# Patient Record
Sex: Female | Born: 1985 | Race: White | Hispanic: No | Marital: Married | Smoking: Never smoker
Health system: Southern US, Community
[De-identification: ages and names within clinical notes are randomized; demographics above are authoritative.]

---

## 2020-05-15 ENCOUNTER — Emergency Department: Payer: 59

## 2020-05-15 ENCOUNTER — Other Ambulatory Visit: Payer: Self-pay

## 2020-05-15 ENCOUNTER — Emergency Department
Admission: EM | Admit: 2020-05-15 | Discharge: 2020-05-15 | Disposition: A | Discharge status: Home / Self Care | Payer: 59 | Attending: Emergency Medicine | Admitting: Emergency Medicine

## 2020-05-15 DIAGNOSIS — R079 Chest pain, unspecified: Secondary | ICD-10-CM | POA: Diagnosis present

## 2020-05-15 DIAGNOSIS — R0602 Shortness of breath: Secondary | ICD-10-CM | POA: Diagnosis not present

## 2020-05-15 DIAGNOSIS — R0789 Other chest pain: Secondary | ICD-10-CM

## 2020-05-15 DIAGNOSIS — M940 Chondrocostal junction syndrome [Tietze]: Secondary | ICD-10-CM | POA: Diagnosis not present

## 2020-05-15 LAB — CBC
HCT: 40 % (ref 36.0–46.0)
Hemoglobin: 13.8 g/dL (ref 12.0–15.0)
MCH: 31.7 pg (ref 26.0–34.0)
MCHC: 34.5 g/dL (ref 30.0–36.0)
MCV: 92 fL (ref 80.0–100.0)
Platelets: 282 10*3/uL (ref 150–400)
RBC: 4.35 MIL/uL (ref 3.87–5.11)
RDW: 11.7 % (ref 11.5–15.5)
WBC: 5.2 10*3/uL (ref 4.0–10.5)
nRBC: 0 % (ref 0.0–0.2)

## 2020-05-15 LAB — BASIC METABOLIC PANEL
Anion gap: 7 (ref 5–15)
BUN: 14 mg/dL (ref 6–20)
CO2: 28 mmol/L (ref 22–32)
Calcium: 8.8 mg/dL — ABNORMAL LOW (ref 8.9–10.3)
Chloride: 107 mmol/L (ref 98–111)
Creatinine, Ser: 0.59 mg/dL (ref 0.44–1.00)
GFR, Estimated: >60 mL/min (ref >60)
Glucose, Bld: 114 mg/dL — ABNORMAL HIGH (ref 70–99)
Potassium: 3.4 mmol/L — ABNORMAL LOW (ref 3.5–5.1)
Sodium: 142 mmol/L (ref 135–145)

## 2020-05-15 LAB — TROPONIN I (HIGH SENSITIVITY): Troponin I (High Sensitivity): 3 ng/L

## 2020-05-15 MED ORDER — PREDNISONE 10 MG (21) PO TBPK: ORAL_TABLET | ORAL | 0 refills | Status: DC

## 2020-05-15 MED ORDER — FAMOTIDINE 20 MG PO TABS: 20.0000 mg | ORAL_TABLET | Freq: Two times a day (BID) | ORAL | 0 refills | Status: AC

## 2020-05-15 MED ORDER — PREDNISONE 20 MG PO TABS
60.0000 mg | ORAL_TABLET | ORAL | Status: AC
  Administered 2020-05-15: 60 mg via ORAL
  Filled 2020-05-15: qty 3

## 2020-05-15 MED ORDER — NAPROXEN 500 MG PO TABS: 500.0000 mg | ORAL_TABLET | Freq: Two times a day (BID) | ORAL | 0 refills | Status: AC

## 2020-05-15 MED ORDER — KETOROLAC TROMETHAMINE 10 MG PO TABS
10.0000 mg | ORAL_TABLET | Freq: Once | ORAL | Status: AC
  Administered 2020-05-15: 10 mg via ORAL
  Filled 2020-05-15: qty 1

## 2020-05-15 NOTE — ED Triage Notes (Signed)
Reports left sided chest pain "smothering" feeling and SOB that started a few hours ago. Reports the SOB is intermittent. Pt tearful.  Pt alert and oriented X4, cooperative, RR even and unlabored, color WNL. Pt in NAD.

## 2020-05-15 NOTE — ED Provider Notes (Signed)
[facility] Emergency Department Provider Note  ____________________________________________  Time seen: Approximately 3:39 PM  I have reviewed the triage vital signs and the nursing notes.   HISTORY  Chief Complaint Chest Pain and Shortness of Breath    HPI Priscilla Edwards is a 34 y.o. female with no significant past medical history who comes ED complaining of central chest pain that feels like tightness that started about 10 AM today, gradual onset, constant, somewhat worsening but waxing and waning.  Worse with talking and movement.  No alleviating factors, nonradiating, associated with intermittent shortness of breath as well.  No cough, not pleuritic, not exertional, not sharp or stabbing.  No recent travel trauma hospitalization or surgery.  Takes no systemic hormonal medication but does have a history of Mirena use.  No history of DVT or PE.      History reviewed. No pertinent past medical history.   There are no problems to display for this patient.    History reviewed. No pertinent surgical history.   Prior to Admission medications   Medication Sig Start Date End Date Taking? Authorizing Provider  famotidine (PEPCID) 20 MG tablet Take 1 tablet (20 mg total) by mouth 2 (two) times daily for 7 days. 05/15/20 05/22/20  Sharman Cheek, MD  naproxen (NAPROSYN) 500 MG tablet Take 1 tablet (500 mg total) by mouth 2 (two) times daily with a meal. 05/15/20   Sharman Cheek, MD  predniSONE (STERAPRED UNI-PAK 21 TAB) 10 MG (21) TBPK tablet 6 tablets on day 1, then 5 tablets on day 2, then 4 tablets on day 3, then 3 tablets on day 4, then 2 tablets on day 5, then 1 tablet on day 6. 05/15/20   Sharman Cheek, MD     Allergies Patient has no known allergies.   No family history on file.  Social History Social History   Tobacco Use  . Smoking status: Never Smoker  Substance Use Topics  . Alcohol use: Not Currently  . Drug use: Not on file     Review of Systems  Constitutional:   No fever or chills.  ENT:   No sore throat. No rhinorrhea. Cardiovascular: Positive chest pain as above without syncope. Respiratory: Positive intermittent shortness of breath as above without cough. Gastrointestinal:   Negative for abdominal pain, vomiting and diarrhea.  Musculoskeletal:   Negative for focal pain or swelling All other systems reviewed and are negative except as documented above in ROS and HPI.  ____________________________________________   PHYSICAL EXAM:  VITAL SIGNS: ED Triage Vitals  Enc Vitals Group     BP 05/15/20 1312 129/85     Pulse Rate 05/15/20 1312 88     Resp 05/15/20 1312 20     Temp 05/15/20 1312 98.4 F (36.9 C)     Temp Source 05/15/20 1312 Oral     SpO2 05/15/20 1312 99 %     Weight 05/15/20 1339 116 lb (52.6 kg)     Height 05/15/20 1339 5\' 2"  (1.575 m)     Head Circumference --      Peak Flow --      Pain Score 05/15/20 1339 5     Pain Loc --      Pain Edu? --      Excl. in GC? --     Vital signs reviewed, nursing assessments reviewed.   Constitutional:   Alert and oriented. Non-toxic appearance. Eyes:   Conjunctivae are normal. EOMI. PERRL. ENT      Head:  Normocephalic and atraumatic.      Nose:   Wearing a mask.      Mouth/Throat:   Wearing a mask.      Neck:   No meningismus. Full ROM. Hematological/Lymphatic/Immunilogical:   No cervical lymphadenopathy. Cardiovascular:   RRR. Symmetric bilateral radial and DP pulses.  No murmurs. Cap refill less than 2 seconds. Respiratory:   Normal respiratory effort without tachypnea/retractions. Breath sounds are clear and equal bilaterally. No wheezes/rales/rhonchi. Gastrointestinal:   Soft and nontender. Non distended. There is no CVA tenderness.  No rebound, rigidity, or guarding. Musculoskeletal:   Normal range of motion in all extremities. No joint effusions.  No lower extremity tenderness.  No edema.  Chest pain reproducible with palpation of  the anterior chest over the sternum as demonstrated by the patient Neurologic:   Normal speech and language.  Motor grossly intact. No acute focal neurologic deficits are appreciated.  Skin:    Skin is warm, dry and intact. No rash noted.  No petechiae, purpura, or bullae.  ____________________________________________    LABS (pertinent positives/negatives) (all labs ordered are listed, but only abnormal results are displayed) Labs Reviewed  BASIC METABOLIC PANEL - Abnormal; Notable for the following components:      Result Value   Potassium 3.4 (*)    Glucose, Bld 114 (*)    Calcium 8.8 (*)    All other components within normal limits  CBC  POC URINE PREG, ED  TROPONIN I (HIGH SENSITIVITY)   ____________________________________________   EKG  Interpreted by me Sinus rhythm rate of 90.  Normal axis and intervals.  Poor R progression.  Normal ST segments and T waves.  No ischemic changes or underlying dysrhythmia.  ____________________________________________    RADIOLOGY  DG Chest 2 View  Result Date: 05/15/2020 CLINICAL DATA:  Chest pain. EXAM: CHEST - 2 VIEW COMPARISON:  No prior. FINDINGS: Mediastinum hilar structures normal. Lungs are clear. No pleural effusion or pneumothorax. Heart size normal. Mild thoracic spine scoliosis. IMPRESSION: No acute cardiopulmonary disease. Electronically Signed   By: Maisie Fus  Register   On: 05/15/2020 13:31    ____________________________________________   PROCEDURES Procedures  ____________________________________________    CLINICAL IMPRESSION / ASSESSMENT AND PLAN / ED COURSE  Medications ordered in the ED: Medications  ketorolac (TORADOL) tablet 10 mg (has no administration in time range)  predniSONE (DELTASONE) tablet 60 mg (has no administration in time range)    Pertinent labs & imaging results that were available during my care of the patient were reviewed by me and considered in my medical decision making (see  chart for details).  Priscilla Edwards was evaluated in Emergency Department on 05/15/2020 for the symptoms described in the history of present illness. She was evaluated in the context of the global COVID-19 pandemic, which necessitated consideration that the patient might be at risk for infection with the SARS-CoV-2 virus that causes COVID-19. Institutional protocols and algorithms that pertain to the evaluation of patients at risk for COVID-19 are in a state of rapid change based on information released by regulatory bodies including the CDC and federal and state organizations. These policies and algorithms were followed during the patient's care in the ED.   Patient presents with atypical, noncardiac chest pain, by physical exam very suggestive of musculoskeletal/costochondritis pain.  This is likely provoked by her recent Covid illness within the last 2 weeks.  Considering the patient's symptoms, medical history, and physical examination today, I have low suspicion for ACS, PE, TAD, pneumothorax, carditis, mediastinitis, pneumonia,  CHF, or sepsis.  PE is very unlikely given normal vital signs, lack of sharp pleuritic chest pain, lack of significant risk factors.  Vital signs unremarkable, EKG nonischemic and reassuring.  Chest x-ray image viewed by me, unremarkable with no signs of pleural effusion pneumothorax or pneumonia.  Radiology report reviewed and agrees.  Initial lab panel all normal.  Troponin was obtained from triage protocol, is normal, with noncardiac presentation, do not feel that cardiac work-up is indicated.  We will treat with NSAIDs, short burst of prednisone, Pepcid for gastric protection.        ____________________________________________   FINAL CLINICAL IMPRESSION(S) / ED DIAGNOSES    Final diagnoses:  Costochondritis  Chest wall pain     ED Discharge Orders         Ordered    naproxen (NAPROSYN) 500 MG tablet  2 times daily with meals        05/15/20 1538     predniSONE (STERAPRED UNI-PAK 21 TAB) 10 MG (21) TBPK tablet        05/15/20 1538    famotidine (PEPCID) 20 MG tablet  2 times daily        05/15/20 1538          Portions of this note were generated with dragon dictation software. Dictation errors may occur despite best attempts at proofreading.   Sharman Cheek, MD 05/15/20 1544

## 2020-07-21 ENCOUNTER — Emergency Department: Payer: 59

## 2020-07-21 ENCOUNTER — Other Ambulatory Visit: Payer: Self-pay

## 2020-07-21 ENCOUNTER — Emergency Department
Admission: EM | Admit: 2020-07-21 | Discharge: 2020-07-21 | Disposition: A | Discharge status: Home / Self Care | Payer: 59 | Attending: Emergency Medicine | Admitting: Emergency Medicine

## 2020-07-21 DIAGNOSIS — M545 Low back pain, unspecified: Secondary | ICD-10-CM | POA: Diagnosis not present

## 2020-07-21 LAB — POC URINE PREG, ED: Preg Test, Ur: NEGATIVE

## 2020-07-21 MED ORDER — DEXAMETHASONE SODIUM PHOSPHATE 10 MG/ML IJ SOLN
10.0000 mg | Freq: Once | INTRAMUSCULAR | Status: AC
  Administered 2020-07-21: 10 mg via INTRAMUSCULAR
  Filled 2020-07-21: qty 1

## 2020-07-21 MED ORDER — HYDROCODONE-ACETAMINOPHEN 5-325 MG PO TABS
1.0000 | ORAL_TABLET | Freq: Once | ORAL | Status: AC
  Administered 2020-07-21: 1 via ORAL
  Filled 2020-07-21: qty 1

## 2020-07-21 MED ORDER — ONDANSETRON 4 MG PO TBDP
4.0000 mg | ORAL_TABLET | Freq: Once | ORAL | Status: AC
  Administered 2020-07-21: 4 mg via ORAL
  Filled 2020-07-21: qty 1

## 2020-07-21 MED ORDER — LIDOCAINE 5 % EX PTCH
1.0000 | MEDICATED_PATCH | CUTANEOUS | Status: DC
  Administered 2020-07-21: 1 via TRANSDERMAL
  Filled 2020-07-21: qty 1

## 2020-07-21 MED ORDER — METHOCARBAMOL 750 MG PO TABS: 750.0000 mg | ORAL_TABLET | Freq: Four times a day (QID) | ORAL | 0 refills | Status: AC | PRN

## 2020-07-21 MED ORDER — PREDNISONE 10 MG PO TABS: ORAL_TABLET | ORAL | 0 refills | Status: AC

## 2020-07-21 MED ORDER — METHOCARBAMOL 500 MG PO TABS
750.0000 mg | ORAL_TABLET | Freq: Once | ORAL | Status: AC
  Administered 2020-07-21: 750 mg via ORAL
  Filled 2020-07-21: qty 2

## 2020-07-21 MED ORDER — ACETAMINOPHEN 325 MG PO TABS
650.0000 mg | ORAL_TABLET | Freq: Once | ORAL | Status: AC
  Administered 2020-07-21: 650 mg via ORAL
  Filled 2020-07-21: qty 2

## 2020-07-21 MED ORDER — LIDOCAINE 5 % EX PTCH: 1.0000 | MEDICATED_PATCH | CUTANEOUS | 0 refills | Status: AC

## 2020-07-21 NOTE — ED Notes (Signed)
Pt provided with cup to provide urine

## 2020-07-21 NOTE — ED Notes (Signed)
PA Priscilla Edwards approved pt to eat. Pt notified.

## 2020-07-21 NOTE — ED Notes (Signed)
Pt on phone with MRI at this time.  

## 2020-07-21 NOTE — ED Provider Notes (Signed)
[facility] Emergency Department Provider Note  ____________________________________________   Event Date/Time   First MD Initiated Contact with Patient 07/21/20 1753     (approximate)  I have reviewed the triage vital signs and the nursing notes.   HISTORY  Chief Complaint Back Pain  HPI Priscilla Edwards is a 35 y.o. female who presents to the emergency department for evaluation of low back pain that has been present since just prior to Christmas.  Patient reports pain has been progressively worsening, particularly over the last week.  Patient reports sharp pain in the middle lower portion of her back, just above the sacrum that feels like a sharp pressure and radiates into the buttocks.  She reports she is unable to sit up due to significant pain.  Also upon sitting, she reports radiating sharp pains into her vaginal area with a sensation like she is going to have loss of control of her bladder or bowels.  She has not actually had loss of these to date.  She reports that in order to avoid the sensation, she has to lay on her back.  She reports her pain "beyond" a 10/10.  She states no history of pain like this. She had made appointment with her primary care for next week, however had acute worsening today during injury. She reports she was trying to hold 1 dog back along trying to get another dog in the door and felt a pop and had immediate weakness of her legs and fell and was unable to get up. She reports that when her stepson returned a short while later he "found her in the floor" and had to help her up. She denies any fevers, current weakness in the extremities. 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         History reviewed. No pertinent past medical history.  There are no problems to display for this patient.   History  reviewed. No pertinent surgical history.  Prior to Admission medications   Medication Sig Start Date End Date Taking? Authorizing Provider  lidocaine (LIDODERM) 5 % Place 1 patch onto the skin daily. Remove & Discard patch within 12 hours or as directed by MD 07/21/20  Yes Lucy Chris, PA  methocarbamol (ROBAXIN-750) 750 MG tablet Take 1 tablet (750 mg total) by mouth 4 (four) times daily as needed for up to 10 days for muscle spasms. 07/21/20 07/31/20 Yes Isadora Delorey, Ruben Gottron, PA  predniSONE (DELTASONE) 10 MG tablet Take 6 tablets (60 mg total) by mouth daily for 1 day, THEN 5 tablets (50 mg total) daily for 1 day, THEN 4 tablets (40 mg total) daily for 1 day, THEN 3 tablets (30 mg total) daily for 1 day, THEN 2 tablets (20 mg total) daily for 1 day, THEN 1 tablet (10 mg total) daily for 1 day. 07/21/20 07/27/20 Yes Solyana Nonaka, Ruben Gottron, PA  famotidine (PEPCID) 20 MG tablet Take 1 tablet (20 mg total) by mouth 2 (two) times daily for 7 days. 05/15/20 05/22/20  Sharman Cheek, MD  naproxen (NAPROSYN) 500 MG tablet Take 1 tablet (500 mg total) by mouth 2 (two) times daily with a meal. 05/15/20   Sharman Cheek, MD    Allergies Patient has no known allergies.  No family history on file.  Social History Social History   Tobacco Use  .  Smoking status: Never Smoker  Substance Use Topics  . Alcohol use: Not Currently    Review of Systems Constitutional: No fever/chills Eyes: No visual changes. ENT: No sore throat. Cardiovascular: Denies chest pain. Respiratory: Denies shortness of breath. Gastrointestinal: No abdominal pain.  No nausea, no vomiting.  No diarrhea.  No constipation. Genitourinary: Negative for dysuria. Musculoskeletal: + back pain Skin: Negative for rash. Neurological: Negative for headaches, focal weakness or numbness.  ____________________________________________   PHYSICAL EXAM:  VITAL SIGNS: ED Triage Vitals  Enc Vitals Group     BP 07/21/20 1743 140/71      Pulse Rate 07/21/20 1743 65     Resp 07/21/20 1743 18     Temp 07/21/20 1743 98.2 F (36.8 C)     Temp src --      SpO2 07/21/20 1743 100 %     Weight 07/21/20 1934 115 lb (52.2 kg)     Height 07/21/20 1934 5\' 2"  (1.575 m)     Head Circumference --      Peak Flow --      Pain Score 07/21/20 1741 10     Pain Loc --      Pain Edu? --      Excl. in GC? --     Constitutional: Alert and oriented. In no acute distress. Upon entering the room, she is laying flat on the bed with knees bent secondary to pain. Eyes: Conjunctivae are normal. PERRL. EOMI. Head: Atraumatic. Nose: No congestion/rhinnorhea. Mouth/Throat: Mucous membranes are moist.  Oropharynx non-erythematous. Neck: No stridor.   Cardiovascular: Normal rate, regular rhythm. Grossly normal heart sounds.  Good peripheral circulation. Respiratory: Normal respiratory effort.  No retractions. Lungs CTAB. Gastrointestinal: Soft and nontender. No distention. No abdominal bruits. No CVA tenderness. Musculoskeletal: There is tenderness to palpation of the midline of the lumbar spine in the L4/L5 region. There is also tenderness of the SI joints bilaterally and gluteal muscles bilaterally. She has 5/5 strength in ankle plantarflexion, dorsiflexion, knee flexion, knee extension and hip flexion bilaterally, though is reproducing her pain in the back. Positive straight leg raise on the left. Distal sensation and pulses intact. Neurologic:  Normal speech and language. No gross focal neurologic deficits are appreciated. No gait instability. Skin:  Skin is warm, dry and intact. No rash noted. Psychiatric: Mood and affect are normal. Speech and behavior are normal. ____________________________________________  RADIOLOGY I, Lucy Chrisaitlin J Pluma Diniz, personally viewed and evaluated these images (plain radiographs) as part of my medical decision making, as well as reviewing the written report by the radiologist.  ED provider interpretation: X-rays  revealed no acute abnormality.  Official radiology report(s): DG Lumbar Spine 2-3 Views  Result Date: 07/21/2020 CLINICAL DATA:  35 year old female with low back pain. EXAM: LUMBAR SPINE - 2-3 VIEW COMPARISON:  None. FINDINGS: Five lumbar type vertebra. There is no acute fracture or subluxation of the lumbar spine. The vertebral body heights and disc spaces are maintained. The visualized posterior elements are intact. The soft tissues are unremarkable. IMPRESSION: Negative. Electronically Signed   By: Elgie CollardArash  Radparvar M.D.   On: 07/21/2020 19:17   DG Pelvis 1-2 Views  Result Date: 07/21/2020 CLINICAL DATA:  Back pain with fall EXAM: PELVIS - 1-2 VIEW COMPARISON:  None. FINDINGS: IUD in the pelvis. Pubic symphysis and rami are intact. No fracture or malalignment. IMPRESSION: Negative. Electronically Signed   By: Jasmine PangKim  Fujinaga M.D.   On: 07/21/2020 19:17   MR LUMBAR SPINE WO CONTRAST  Result Date: 07/21/2020 CLINICAL  DATA:  Low back pain.  Cauda equina syndrome suspected. EXAM: MRI LUMBAR SPINE WITHOUT CONTRAST TECHNIQUE: Multiplanar, multisequence MR imaging of the lumbar spine was performed. No intravenous contrast was administered. COMPARISON:  Lumbar radiographs 07/21/2020 FINDINGS: Segmentation:  Normal.  Lowest disc space L5-S1. Alignment:  Normal Vertebrae:  Normal bone marrow.  Negative for fracture or mass. Conus medullaris and cauda equina: Conus extends to the L1-2 level. Conus and cauda equina appear normal. Paraspinal and other soft tissues: Negative for paraspinous mass, adenopathy, or soft tissue edema. Small amount of free fluid in the pelvis. Disc levels: L1-2: Normal L2-3: Normal L3-4: Small central disc protrusion and mild facet degeneration. No significant stenosis or neural impingement L4-5: Small to moderate central disc protrusion indenting the thecal sac. No significant spinal or foraminal stenosis. Negative for neural impingement. L5-S1: Negative IMPRESSION: Small central disc  protrusions L3-4 and L4-5. No significant neural impingement Electronically Signed   By: Marlan Palau M.D.   On: 07/21/2020 21:56    ____________________________________________   INITIAL IMPRESSION / ASSESSMENT AND PLAN / ED COURSE  As part of my medical decision making, I reviewed the following data within the electronic MEDICAL RECORD NUMBER Nursing notes reviewed and incorporated, Radiograph reviewed  and [place] Controlled Substance Database        Patient is a 35 year old female who presents to the emergency department for evaluation of significant back pain with limited range of motion, reported weakness and sensation that she is going to lose control of her bowel or bladder when in a seated position. See HPI for further details. In triage, the patient has normal vital signs and is afebrile. On physical exam, the patient does not have any focal neurodeficits appreciated, though she does appear in a significant amount of pain. She also has a reported history of "tumors" on her spine. These were reported in roughly 1993 in 1998. Review of the patient's chart does reveal that she was admitted to [facility] around the timeline of the dates provided, though the specific details of the admission are not available given their age. Initial work-up began with x-rays of the lumbar spine and pelvis which were negative for any acute fracture. Given the patient's significant amount of pain as well as weakness to the point where she was unable to get herself up out of the floor as well as reports of feeling like she is going to lose control of her bowel or bladder, MRI of the lumbar spine was performed for concern for cauda equina syndrome. Low suspicion for osteomyelitis, discitis or other infectious cause given the patient's lack of systemic symptoms or fever. MRI was negative for cauda equina though there is some mild disc irregularity. We will treat the patient for her significant amount of acute low back  pain. Given that the patient is not diabetic, we will begin with IM Decadron as well as lidocaine patch, Robaxin and Tylenol. We will send outpatient prescriptions for prednisone pack as well as muscle relaxant. Patient is amenable with this plan. Strict return precautions were discussed with the patient. We will have the patient follow-up with neurosurgery to ensure resolution or to determine next steps if the symptoms do not improve. Patient is amenable with this plan she is stable this time for outpatient therapy.      ____________________________________________   FINAL CLINICAL IMPRESSION(S) / ED DIAGNOSES  Final diagnoses:  Acute midline low back pain without sciatica     ED Discharge Orders  Ordered    lidocaine (LIDODERM) 5 %  Every 24 hours        07/21/20 2230    predniSONE (DELTASONE) 10 MG tablet        07/21/20 2230    methocarbamol (ROBAXIN-750) 750 MG tablet  4 times daily PRN        07/21/20 2230          *Please note:  Tmya Wigington was evaluated in Emergency Department on 07/21/2020 for the symptoms described in the history of present illness. She was evaluated in the context of the global COVID-19 pandemic, which necessitated consideration that the patient might be at risk for infection with the SARS-CoV-2 virus that causes COVID-19. Institutional protocols and algorithms that pertain to the evaluation of patients at risk for COVID-19 are in a state of rapid change based on information released by regulatory bodies including the CDC and federal and state organizations. These policies and algorithms were followed during the patient's care in the ED.  Some ED evaluations and interventions may be delayed as a result of limited staffing during and the pandemic.*   Note:  This document was prepared using Dragon voice recognition software and may include unintentional dictation errors.    Lucy Chris, PA 07/21/20 2337    Concha Se, MD 07/25/20  [phone]

## 2020-07-21 NOTE — ED Triage Notes (Addendum)
Pt comes via POV from home with c/o lower back pain. Pt states it is really painful. Pt states this started around Christmas and has gotten worse.  Pt states today it was so bad she felt she couldn't bear weight. Pt states something may have popped.  Pt states back spasms.

## 2021-05-25 ENCOUNTER — Other Ambulatory Visit: Payer: Self-pay | Admitting: Student

## 2021-05-25 DIAGNOSIS — G43019 Migraine without aura, intractable, without status migrainosus: Secondary | ICD-10-CM

## 2021-05-28 ENCOUNTER — Ambulatory Visit: Payer: 59

## 2021-06-09 ENCOUNTER — Ambulatory Visit
Admission: RE | Admit: 2021-06-09 | Discharge: 2021-06-09 | Disposition: A | Discharge status: Home / Self Care | Payer: 59 | Source: Ambulatory Visit | Attending: Student | Admitting: Student

## 2021-06-09 DIAGNOSIS — G43019 Migraine without aura, intractable, without status migrainosus: Secondary | ICD-10-CM | POA: Insufficient documentation

## 2023-04-24 IMAGING — MR MR HEAD W/O CM
11 series · 47 of 48 positions shown · non-contrast
Comparison: No pertinent prior exams available for comparison.

CLINICAL DATA: Intractable migraine without Joshjax and without status
migrainosus. Additional history provided by scanning technologist:
Patient reports chronic headaches (for 20 years), lightheadedness,
nausea, dizziness.

EXAM:
MRI HEAD WITHOUT CONTRAST
TECHNIQUE: Multiplanar, multiecho pulse sequences of the brain and surrounding
structures were obtained without intravenous contrast.

[Series 5: ax dwi_tracew · axial · 3.0mm · 0.65mm/px · z∈[-112,+41]mm · 4 of 48 slices shown]
[im 1/48]
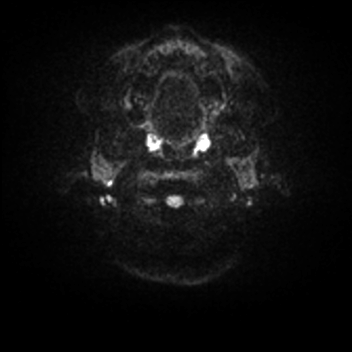
[im 16/48]
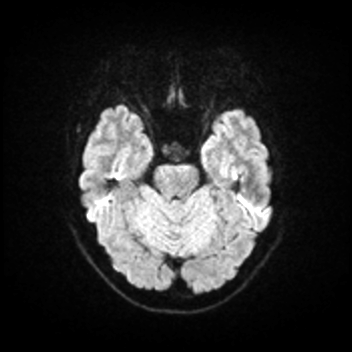
[im 32/48]
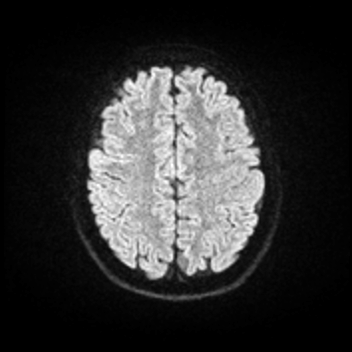
[im 48/48]
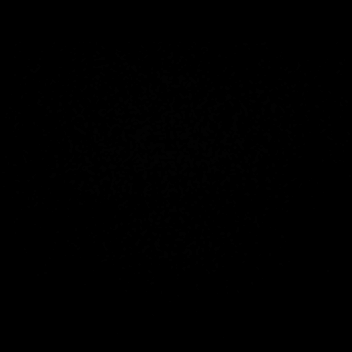

[Series 6: ax dwi_adc · axial · 3.0mm · 0.65mm/px · z∈[-112,+31]mm · 4 of 45 slices shown]
[im 1/45]
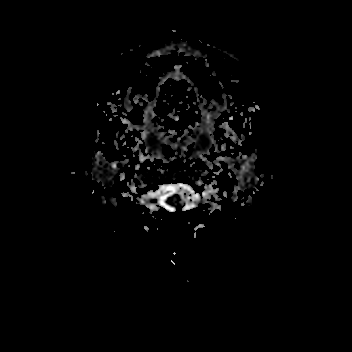
[im 15/45]
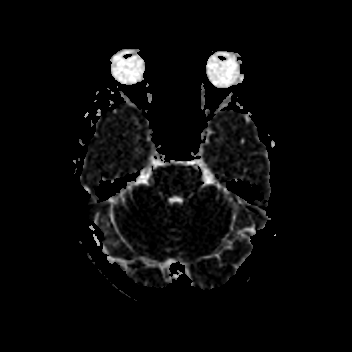
[im 30/45]
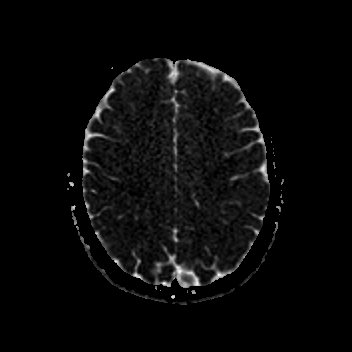
[im 45/45]
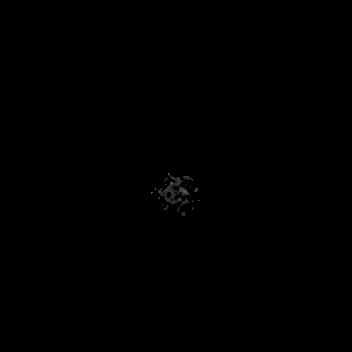

[Series 7: cor dwi_tracew · coronal · 5.0mm · 0.65mm/px · 3 of 40 slices shown]
[im 1/40]
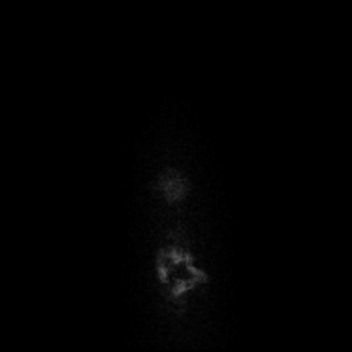
[im 20/40]
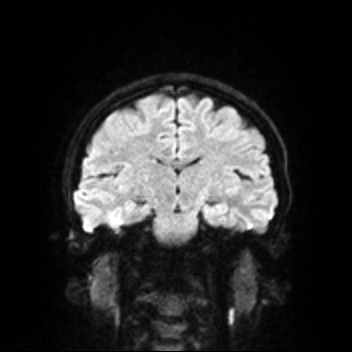
[im 40/40]
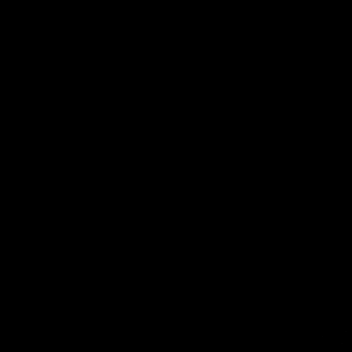

[Series 8: cor dwi_adc · coronal · 5.0mm · 0.65mm/px · 3 of 37 slices shown]
[im 1/37]
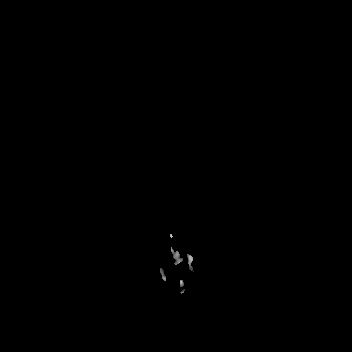
[im 19/37]
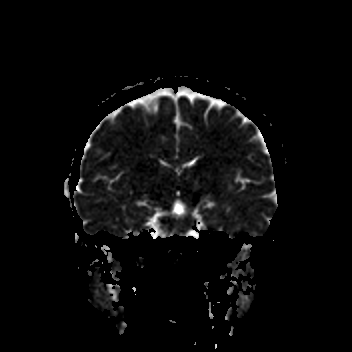
[im 37/37]
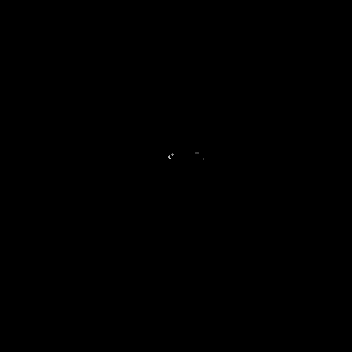

[Series 9: T1 · sagittal · 5.0mm · 0.62mm/px · 2 of 24 slices shown (1 of 2)]
[im 1/24]
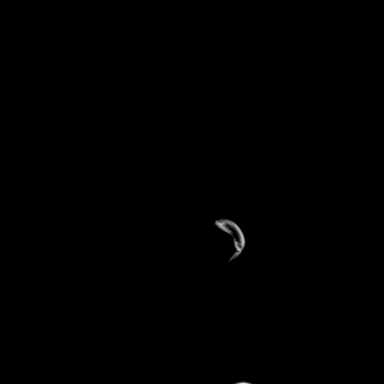
[im 24/24]
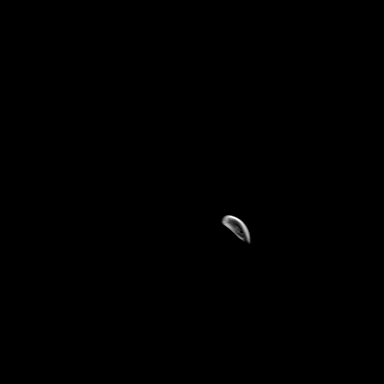

[Series 10: T2 · axial · 5.0mm · 0.53mm/px · z∈[-105,+36]mm · 2 of 25 slices shown (1 of 2)]
[im 1/25]
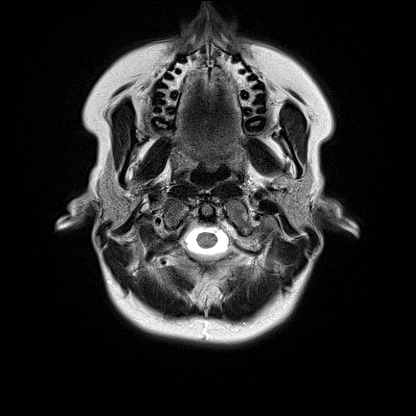
[im 25/25]
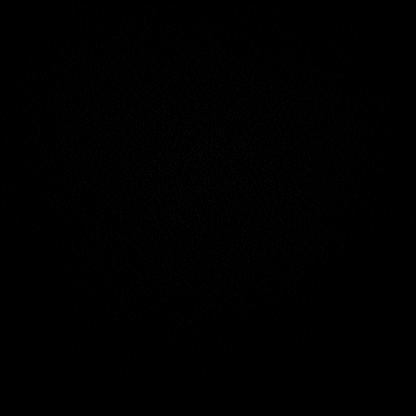

[Series 12: pha_images · axial · 3.0mm · 0.90mm/px · z∈[-122,+49]mm · 5 of 56 slices shown]
[im 1/56]
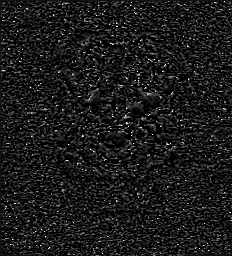
[im 14/56]
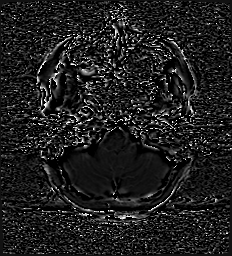
[im 28/56]
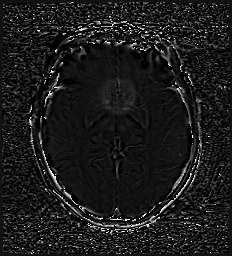
[im 42/56]
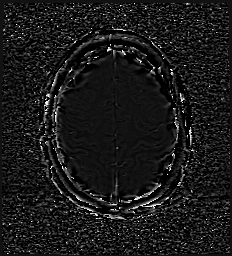
[im 56/56]
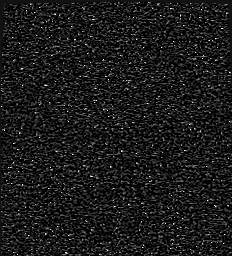

[Series 13: swi_images · axial · 3.0mm · 0.90mm/px · z∈[-122,+52]mm · 5 of 60 slices shown]
[im 1/60]
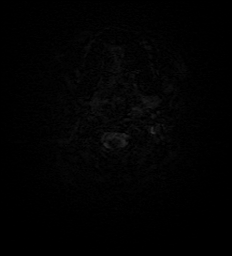
[im 15/60]
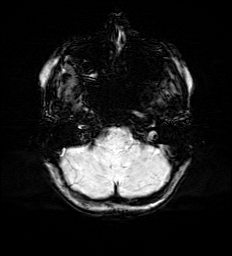
[im 30/60]
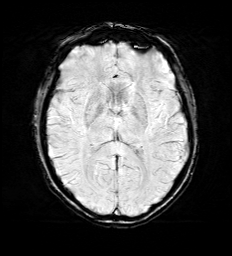
[im 45/60]
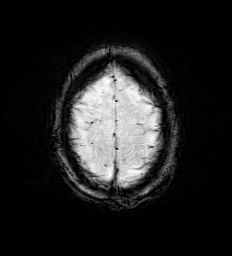
[im 60/60]
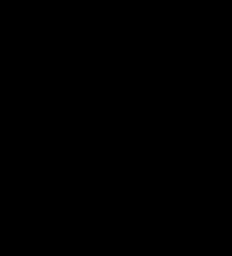

[Series 15: FLAIR · axial · 3.0mm · 0.53mm/px · z∈[-114,+45]mm · 4 of 55 slices shown]
[im 1/55]
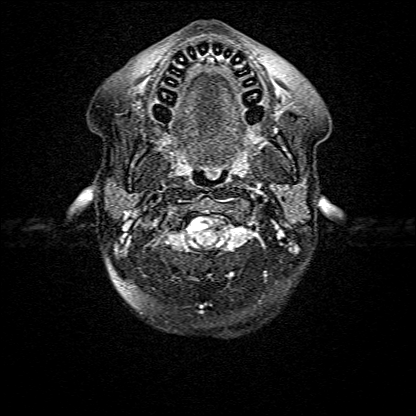
[im 19/55]
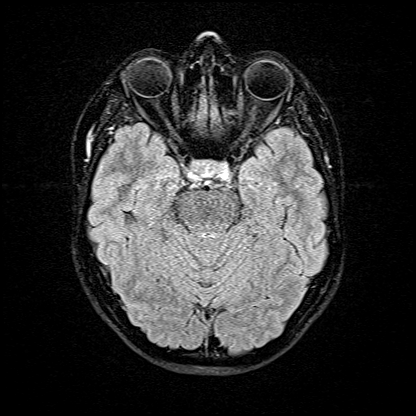
[im 37/55]
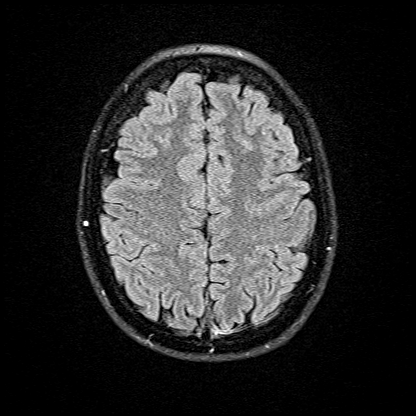
[im 55/55]
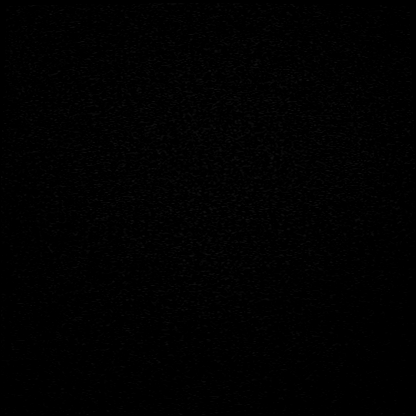

[Series 16: T1 · axial · 1.0mm · 0.98mm/px · z∈[-123,+49]mm · 13 of 176 slices shown (2 of 2)]
[im 1/176]
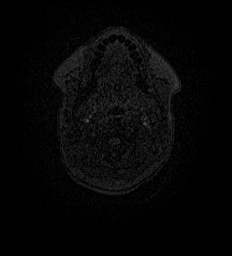
[im 14/176]
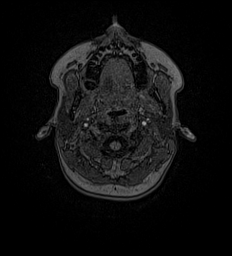
[im 27/176]
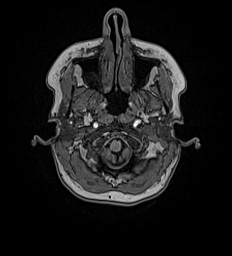
[im 41/176]
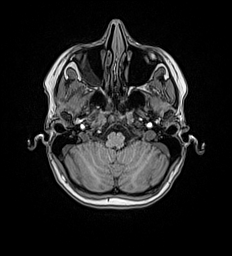
[im 54/176]
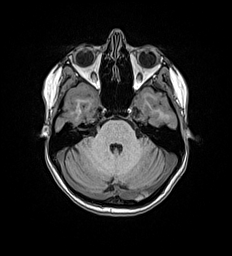
[im 68/176]
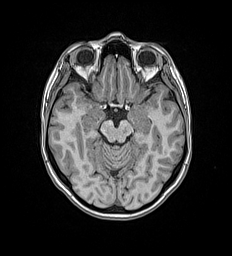
[im 81/176]
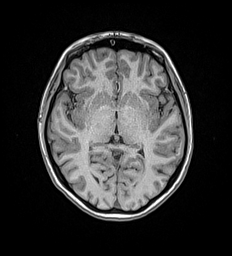
[im 95/176]
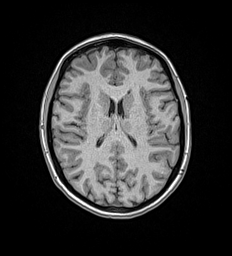
[im 108/176]
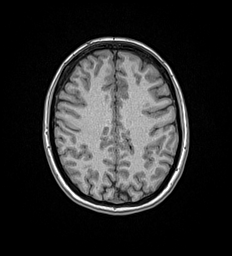
[im 122/176]
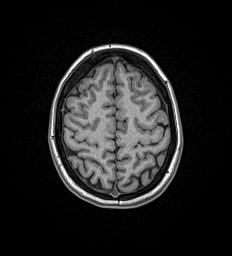
[im 135/176]
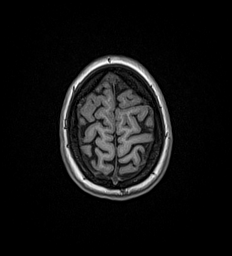
[im 149/176]
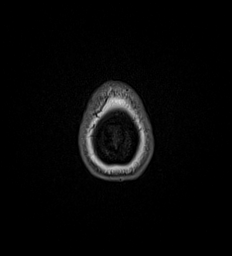
[im 176/176]
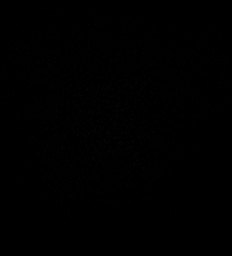

[Series 17: T2 · coronal · 5.0mm · 0.57mm/px · 2 of 29 slices shown (2 of 2)]
[im 1/29]
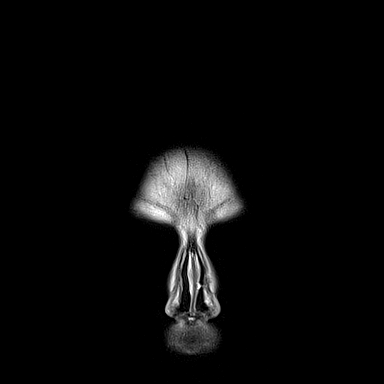
[im 29/29]
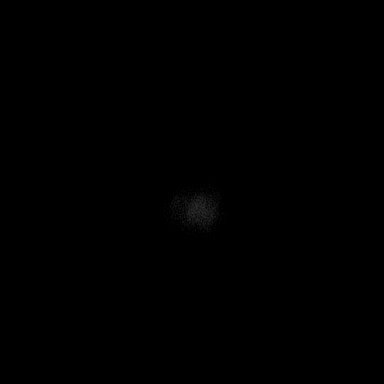

[47 of 48 positions shown; findings below may reference images not displayed]

FINDINGS: Brain:

Cerebral volume is normal.

No cortical encephalomalacia is identified. No significant cerebral
white matter disease.

There is no acute infarct.

No evidence of an intracranial mass.

No chronic intracranial blood products.

No extra-axial fluid collection.

No midline shift.

Vascular: Maintained flow voids within the proximal large arterial
vessels.

Skull and upper cervical spine: No focal suspicious marrow lesion.

Sinuses/Orbits: Visualized orbits show no acute finding. 3.4 cm
right maxillary sinus mucous retention cyst.
IMPRESSION: Unremarkable non-contrast MRI appearance of the brain. No evidence
of acute intracranial abnormality.

3.4 cm mucous retention cyst within the right maxillary sinus.
